# Patient Record
Sex: Male | Born: 1955 | Race: White | Hispanic: No | Marital: Married
Health system: Midwestern US, Academic
[De-identification: ages and names within clinical notes are randomized; demographics above are authoritative.]

---

## 2018-07-02 ENCOUNTER — Encounter: Admit: 2018-07-02 | Discharge: 2018-07-03 | Payer: Commercial Managed Care - HMO

## 2018-08-07 ENCOUNTER — Encounter: Admit: 2018-08-07 | Discharge: 2018-08-07 | Payer: Commercial Managed Care - HMO

## 2018-08-07 DIAGNOSIS — M545 Low back pain: Principal | ICD-10-CM

## 2018-08-08 ENCOUNTER — Encounter: Admit: 2018-08-08 | Discharge: 2018-08-08 | Payer: Commercial Managed Care - HMO

## 2018-08-08 DIAGNOSIS — E119 Type 2 diabetes mellitus without complications: Principal | ICD-10-CM

## 2018-08-08 DIAGNOSIS — I10 Essential (primary) hypertension: ICD-10-CM

## 2018-08-08 DIAGNOSIS — E7800 Pure hypercholesterolemia, unspecified: ICD-10-CM

## 2018-08-13 ENCOUNTER — Ambulatory Visit: Admit: 2018-08-13 | Discharge: 2018-08-13 | Payer: Commercial Managed Care - HMO

## 2018-08-13 ENCOUNTER — Encounter: Admit: 2018-08-13 | Discharge: 2018-08-13 | Payer: Commercial Managed Care - HMO

## 2018-08-13 DIAGNOSIS — E7800 Pure hypercholesterolemia, unspecified: ICD-10-CM

## 2018-08-13 DIAGNOSIS — I10 Essential (primary) hypertension: ICD-10-CM

## 2018-08-13 DIAGNOSIS — M545 Low back pain: Principal | ICD-10-CM

## 2018-08-13 DIAGNOSIS — E119 Type 2 diabetes mellitus without complications: Principal | ICD-10-CM

## 2018-08-13 DIAGNOSIS — M48062 Spinal stenosis, lumbar region with neurogenic claudication: ICD-10-CM

## 2018-09-12 ENCOUNTER — Encounter: Admit: 2018-09-12 | Discharge: 2018-09-12 | Payer: Commercial Managed Care - HMO

## 2018-11-05 ENCOUNTER — Ambulatory Visit: Admit: 2018-11-05 | Discharge: 2018-11-06 | Payer: Commercial Managed Care - HMO

## 2018-11-05 ENCOUNTER — Encounter: Admit: 2018-11-05 | Discharge: 2018-11-05 | Payer: Commercial Managed Care - HMO

## 2018-11-05 DIAGNOSIS — E119 Type 2 diabetes mellitus without complications: Principal | ICD-10-CM

## 2018-11-05 DIAGNOSIS — M48062 Spinal stenosis, lumbar region with neurogenic claudication: Principal | ICD-10-CM

## 2018-11-05 DIAGNOSIS — E7800 Pure hypercholesterolemia, unspecified: ICD-10-CM

## 2018-11-05 DIAGNOSIS — I10 Essential (primary) hypertension: ICD-10-CM

## 2018-11-05 NOTE — Patient Instructions
Can take Aleve, two in the morning and two at night.

## 2018-11-06 ENCOUNTER — Encounter: Admit: 2018-11-06 | Discharge: 2018-11-06 | Payer: Commercial Managed Care - HMO

## 2018-11-06 DIAGNOSIS — M4316 Spondylolisthesis, lumbar region: ICD-10-CM

## 2018-11-06 DIAGNOSIS — M5416 Radiculopathy, lumbar region: Principal | ICD-10-CM

## 2018-11-14 ENCOUNTER — Ambulatory Visit: Admit: 2018-11-14 | Discharge: 2018-11-15 | Payer: Commercial Managed Care - HMO

## 2018-11-14 ENCOUNTER — Encounter: Admit: 2018-11-14 | Discharge: 2018-11-14 | Payer: Commercial Managed Care - HMO

## 2018-11-14 DIAGNOSIS — E7800 Pure hypercholesterolemia, unspecified: ICD-10-CM

## 2018-11-14 DIAGNOSIS — M545 Low back pain: ICD-10-CM

## 2018-11-14 DIAGNOSIS — I10 Essential (primary) hypertension: ICD-10-CM

## 2018-11-14 DIAGNOSIS — E119 Type 2 diabetes mellitus without complications: Principal | ICD-10-CM

## 2018-11-14 LAB — POC GLUCOSE: Lab: 187 mg/dL — ABNORMAL HIGH (ref 70–100)

## 2018-11-14 MED ORDER — TRIAMCINOLONE ACETONIDE 40 MG/ML IJ SUSP
80 mg | Freq: Once | EPIDURAL | 0 refills | Status: CP
Start: 2018-11-14 — End: ?
  Administered 2018-11-14: 20:00:00 80 mg via EPIDURAL

## 2018-11-14 MED ORDER — IOHEXOL 240 MG IODINE/ML IV SOLN
2.5 mL | Freq: Once | EPIDURAL | 0 refills | Status: CP
Start: 2018-11-14 — End: ?
  Administered 2018-11-14: 20:00:00 2.5 mL via EPIDURAL

## 2018-11-14 NOTE — Patient Instructions
Procedure Completed Today: Lumbar Transforaminal Steroid Injection    Important information following your procedure today: You may drive today    1. Pain relief may not be immediate. It is possible you may even experience an increase in pain during the first 24-48 hours followed by a gradual decrease of your pain.  2. Though the procedure is generally safe and complications are rare, we do ask that you be aware of any of the following:   ? Any swelling, persistent redness, new bleeding, or drainage from the site of the injection.  ? You should not experience a severe headache.  ? You should not run a fever over 101? F.  ? New onset of sharp, severe back & or neck pain.  ? New onset of upper or lower extremity numbness or weakness.  ? New difficulty controlling bowel or bladder function after the injection.  ? New shortness of breath.    If any of these occur, please call to report this occurrence to a nurse at [phone]. If you are calling after 4:00 p.m., on weekends or holidays please call [phone] and ask to have the resident physician on call for the physician paged or go to your local emergency room.  3. You may experience soreness at the injection site. Ice can be applied at 20 minute intervals. Avoid application of direct heat, hot showers or hot tubs today.  4. Avoid strenuous activity today. You may resume your regular activities and exercise tomorrow.  5. Patients with diabetes may see an elevation in blood sugars for 7-10 days after the injection. It is important to pay close attention to your diet, check your blood sugars daily and report extreme elevations to the physician that treats your diabetes.  6. Patients taking a daily blood thinner can resume their regular dose this evening.  7. It is important that you take all medications ordered by your pain physician. Taking medication as ordered is an important part of your pain care plan. If you cannot continue the medication plan, please notify the physician.     Possible side effects to steroids that may occur:  ? Flushing or redness of the face  ? Irritability  ? Fluid retention  ? Change in women?s menses    The following medications were used: Lidocaine , Triamcinolone   and Contrast Dye

## 2018-11-14 NOTE — Progress Notes
???   Number of children: Not on file   ??? Years of education: Not on file   ??? Highest education level: Not on file   Occupational History   ??? Not on file   Tobacco Use   ??? Smoking status: Never Smoker   ??? Smokeless tobacco: Never Used   Substance and Sexual Activity   ??? Alcohol use: Never     Frequency: Never   ??? Drug use: Not on file   ??? Sexual activity: Not on file   Other Topics Concern   ??? Not on file   Social History Narrative   ??? Not on file       Allergies   Allergen Reactions   ??? Penicillins HIVES and EDEMA     Swollen throat and neck        Vitals:    11/14/18 1407   BP: (!) 151/93   BP Source: Arm, Right Upper   Patient Position: Sitting   Pulse: 96   Resp: 15   SpO2: 97%       REVIEW OF SYSTEMS: 10 point ROS obtained and negative except per HPI      PHYSICAL EXAM:  General: Alert, cooperative, no acute distress.  HEENT: Normocephalic, atraumatic.  Neck: Supple.  Lungs: Unlabored respirations, bilateral and equal chest excursion.  Heart: Regular rate.  Skin: Warm and dry to touch.  Abdomen: Nondistended.    Lumbar spine:  Appearance: No lesions or deformity  Lumbar tenderness: No  SI joint tenderness: Bilateral    Pain with extension: Yes  Pain with lateral flexion: Bilateral     Lower extremity strength: 5/5 bilaterally  Hip flexion, hip adduction, knee extension, knee flexion, dorsiflexion, hallux extension, plantarflexion tested  Sensation to light touch: Intact and equal in the bilateral lower extremities with exception of posterolateral bilateral lower extremities, right greater than left, into dorsum of foot and lateral edge and lateral plantar surface of foot bilaterally to light touch.  Reflexes:  2/4 in bilateral patellar and achilles tendons    Straight leg raise positive?: None    Neurological: Alert and oriented x3.        IMPRESSION:    1. Spinal stenosis of lumbar region with neurogenic claudication    2. Spondylolisthesis, lumbar region PLAN: Lumbar Transforaminal Steroid Injection L4-L5, L5-S1 Right Sided    Mickel Crow, MD  PGY 5, Pain Fellow  Department of Anesthesia and Pain Management  Pager [phone] (Chronic Pain Service)

## 2018-11-15 ENCOUNTER — Ambulatory Visit: Admit: 2018-11-14 | Discharge: 2018-11-15 | Payer: Commercial Managed Care - HMO

## 2018-11-15 DIAGNOSIS — E7800 Pure hypercholesterolemia, unspecified: ICD-10-CM

## 2018-11-15 DIAGNOSIS — E785 Hyperlipidemia, unspecified: ICD-10-CM

## 2018-11-15 DIAGNOSIS — Z880 Allergy status to penicillin: ICD-10-CM

## 2018-11-15 DIAGNOSIS — M4316 Spondylolisthesis, lumbar region: ICD-10-CM

## 2018-11-15 DIAGNOSIS — M5116 Intervertebral disc disorders with radiculopathy, lumbar region: Principal | ICD-10-CM

## 2018-11-15 DIAGNOSIS — E119 Type 2 diabetes mellitus without complications: ICD-10-CM

## 2018-11-15 DIAGNOSIS — M48062 Spinal stenosis, lumbar region with neurogenic claudication: ICD-10-CM

## 2018-11-15 DIAGNOSIS — I10 Essential (primary) hypertension: ICD-10-CM

## 2018-11-28 ENCOUNTER — Encounter: Admit: 2018-11-28 | Discharge: 2018-11-28 | Payer: Commercial Managed Care - HMO

## 2018-12-03 ENCOUNTER — Encounter: Admit: 2018-12-03 | Discharge: 2018-12-03 | Payer: Commercial Managed Care - HMO

## 2018-12-03 DIAGNOSIS — M48062 Spinal stenosis, lumbar region with neurogenic claudication: Principal | ICD-10-CM

## 2018-12-12 ENCOUNTER — Encounter: Admit: 2018-12-12 | Discharge: 2018-12-12 | Payer: Commercial Managed Care - HMO

## 2018-12-26 ENCOUNTER — Encounter: Admit: 2018-12-26 | Discharge: 2018-12-26 | Payer: Commercial Managed Care - HMO

## 2018-12-26 NOTE — Telephone Encounter
Left message with patient and/or wife asking them to return call to gather information needed about previous injection for insurance approval for procedure on 03/04/2019.   Will need to determine if 1)pain was reduced by 50% 2) level of function increased 3) patient was able to decrease use of medication and/or therapy.    Asked for a return call to RN.

## 2019-01-11 ENCOUNTER — Encounter: Admit: 2019-01-11 | Discharge: 2019-01-11 | Payer: Commercial Managed Care - HMO

## 2019-01-11 NOTE — Telephone Encounter
Called patient and left message with patient asking her to return call to gather information needed about previous injection for insurance approval for procedure on 03/04/2019.   Will need to determine if 1)pain was reduced by 50% 2) level of function increased 3) patient was able to decrease use of medication and/or therapy.  ???

## 2019-01-23 ENCOUNTER — Encounter: Admit: 2019-01-23 | Discharge: 2019-01-23 | Payer: Commercial Managed Care - HMO

## 2019-01-23 NOTE — Telephone Encounter
Patient was called 4 times   12/12/2018, 12/14/2018, 12/26/2018, 01/11/2019 with request for additional information needed for insurance authorization for procedure scheduled on 03/04/2019.  Patients appointment cancelled.

## 2019-02-28 ENCOUNTER — Encounter: Admit: 2019-02-28 | Discharge: 2019-02-28

## 2019-02-28 DIAGNOSIS — M4316 Spondylolisthesis, lumbar region: Secondary | ICD-10-CM

## 2019-02-28 DIAGNOSIS — M5116 Intervertebral disc disorders with radiculopathy, lumbar region: Secondary | ICD-10-CM

## 2019-02-28 DIAGNOSIS — M48062 Spinal stenosis, lumbar region with neurogenic claudication: Secondary | ICD-10-CM

## 2019-03-22 ENCOUNTER — Encounter: Admit: 2019-03-22 | Discharge: 2019-03-22

## 2019-03-22 DIAGNOSIS — M48062 Spinal stenosis, lumbar region with neurogenic claudication: Secondary | ICD-10-CM

## 2019-03-25 ENCOUNTER — Encounter: Admit: 2019-03-25 | Discharge: 2019-03-25

## 2019-03-25 ENCOUNTER — Ambulatory Visit: Admit: 2019-03-25 | Discharge: 2019-03-26

## 2019-03-25 DIAGNOSIS — M48062 Spinal stenosis, lumbar region with neurogenic claudication: Secondary | ICD-10-CM

## 2019-03-25 DIAGNOSIS — E7800 Pure hypercholesterolemia, unspecified: Secondary | ICD-10-CM

## 2019-03-25 DIAGNOSIS — I10 Essential (primary) hypertension: Secondary | ICD-10-CM

## 2019-03-25 DIAGNOSIS — M5116 Intervertebral disc disorders with radiculopathy, lumbar region: Secondary | ICD-10-CM

## 2019-03-25 DIAGNOSIS — E119 Type 2 diabetes mellitus without complications: Secondary | ICD-10-CM

## 2019-03-25 MED ORDER — TRIAMCINOLONE ACETONIDE 40 MG/ML IJ SUSP
80 mg | Freq: Once | EPIDURAL | 0 refills | Status: CP
Start: 2019-03-25 — End: ?
  Administered 2019-03-25: 16:00:00 80 mg via EPIDURAL

## 2019-03-25 MED ORDER — IOHEXOL 240 MG IODINE/ML IV SOLN
2.5 mL | Freq: Once | EPIDURAL | 0 refills | Status: CP
Start: 2019-03-25 — End: ?
  Administered 2019-03-25: 16:00:00 2.5 mL via EPIDURAL

## 2019-03-25 NOTE — Patient Instructions
Procedure Completed Today: Lumbar Transforaminal Steroid Injection    Important information following your procedure today: You may drive today    1. Pain relief may not be immediate. It is possible you may even experience an increase in pain during the first 24-48 hours followed by a gradual decrease of your pain.  2. Though the procedure is generally safe and complications are rare, we do ask that you be aware of any of the following:   ? Any swelling, persistent redness, new bleeding, or drainage from the site of the injection.  ? You should not experience a severe headache.  ? You should not run a fever over 101? F.  ? New onset of sharp, severe back & or neck pain.  ? New onset of upper or lower extremity numbness or weakness.  ? New difficulty controlling bowel or bladder function after the injection.  ? New shortness of breath.    If any of these occur, please call to report this occurrence to a nurse at [phone]. If you are calling after 4:00 p.m., on weekends or holidays please call [phone] and ask to have the resident physician on call for the physician paged or go to your local emergency room.  3. You may experience soreness at the injection site. Ice can be applied at 20 minute intervals. Avoid application of direct heat, hot showers or hot tubs today.  4. Avoid strenuous activity today. You may resume your regular activities and exercise tomorrow.  5. Patients with diabetes may see an elevation in blood sugars for 7-10 days after the injection. It is important to pay close attention to your diet, check your blood sugars daily and report extreme elevations to the physician that treats your diabetes.  6. Patients taking a daily blood thinner can resume their regular dose this evening.  7. It is important that you take all medications ordered by your pain physician. Taking medication as ordered is an important part of your pain care plan. If you cannot continue the medication plan, please notify the physician.     Possible side effects to steroids that may occur:  ? Flushing or redness of the face  ? Irritability  ? Fluid retention  ? Change in women?s menses    The following medications were used: Lidocaine , Triamcinolone   and Contrast Dye

## 2019-03-25 NOTE — Progress Notes
[facility]  INTERVENTIONAL PAIN PROCEDURE HISTORY AND PHYSICAL    Chief Complaint   Patient presents with   ??? Lower Back - Pain   ??? Procedure       HISTORY OF PRESENT ILLNESS:  Trevor Benitez presents in follow up for care regarding back and right leg pain, the last injection helped more than 50% for 2-3 months    Medical History:   Diagnosis Date   ??? Diabetes (HCC)    ??? High cholesterol    ??? Hypertension        Surgical History:   Procedure Laterality Date   ??? COLONOSCOPY         family history includes Heart problem in his mother; Stroke in his father.    Social History     Socioeconomic History   ??? Marital status: Married     Spouse name: Not on file   ??? Number of children: Not on file   ??? Years of education: Not on file   ??? Highest education level: Not on file   Occupational History   ??? Not on file   Tobacco Use   ??? Smoking status: Never Smoker   ??? Smokeless tobacco: Never Used   Substance and Sexual Activity   ??? Alcohol use: Never     Frequency: Never   ??? Drug use: Not on file   ??? Sexual activity: Not on file   Other Topics Concern   ??? Not on file   Social History Narrative   ??? Not on file       Allergies   Allergen Reactions   ??? Penicillins HIVES and EDEMA     Swollen throat and neck        Vitals:    03/25/19 1022   BP: (!) 158/89   BP Source: Arm, Right Upper   Patient Position: Sitting   Pulse: 91   Resp: 16   Temp: 36.9 ???C (98.5 ???F)   TempSrc: Oral   SpO2: 98%   Weight: 98.9 kg (218 lb)   PainSc: Five       REVIEW OF SYSTEMS: 10 point ROS obtained and negative except back and leg pain      PHYSICAL EXAM:  General: Alert and oriented, very pleasant male.   HEENT showed extraocular muscles were intact and no other abnormalities.  Unlabored breathing.  Regular rate and rhythm on CV exam.   5/5 strength in bilateral upper and lower extremities.    Sensation is intact to light touch and equal in the upper and lower extremities.    Right lumbar tenderness to palpation      IMPRESSION: 1. Spinal stenosis of lumbar region with neurogenic claudication    2. Spondylolisthesis, lumbar region    3. Radiculopathy due to lumbar intervertebral disc disorder         PLAN: Lumbar Transforaminal Steroid Injection at right L4-5 and L5-S1

## 2019-03-25 NOTE — Progress Notes

## 2019-03-26 ENCOUNTER — Encounter: Admit: 2019-03-26 | Discharge: 2019-03-26

## 2019-03-26 ENCOUNTER — Ambulatory Visit: Admit: 2019-03-25 | Discharge: 2019-03-26

## 2019-03-26 DIAGNOSIS — M4316 Spondylolisthesis, lumbar region: Secondary | ICD-10-CM

## 2019-03-26 DIAGNOSIS — M5116 Intervertebral disc disorders with radiculopathy, lumbar region: Secondary | ICD-10-CM

## 2019-03-26 NOTE — Procedures
Attending Surgeon: Gabriel Rung, MD    Anesthesia: Local    Pre-Procedure Diagnosis:   1. Spinal stenosis of lumbar region with neurogenic claudication    2. Spondylolisthesis, lumbar region    3. Radiculopathy due to lumbar intervertebral disc disorder        Post-Procedure Diagnosis:   1. Spinal stenosis of lumbar region with neurogenic claudication    2. Spondylolisthesis, lumbar region    3. Radiculopathy due to lumbar intervertebral disc disorder        SNR/TF LMBR/SAC  Procedure: transforaminal epidural    Laterality: right    Location: lumbar - L4-5 and L5-S1      Consent:   Consent obtained: written  Consent given by: patient  Risks discussed: allergic reaction, bleeding, infection, weakness and no change or worsening in pain  Alternatives discussed: alternative treatment     Universal Protocol:  Relevant documents: relevant documents present and verified  Test results: test results available and properly labeled  Imaging studies: imaging studies available  Required items: required blood products, implants, devices, and special equipment available  Site marked: the operative site was marked  Patient identity confirmed: Patient identify confirmed verbally with patient.        Time out: Immediately prior to procedure a time out was called to verify the correct patient, procedure, equipment, support staff and site/side marked as required      Procedures Details:   Indications: pain   Prep: chlorhexidine  Estimated Blood Loss: minimal  Specimens: none  Approach: right paramedian  Needle size: 25 G  Injection procedure: Negative aspiration for blood  Patient tolerance: Patient tolerated the procedure well with no immediate complications. Pressure was applied, and hemostasis was accomplished.  Outcome: Pain improved  Comments: Kenalog 40 mg was injected at each location        Estimated blood loss: none or minimal  Specimens: none  Patient tolerated the procedure well with no immediate complications. Pressure was applied, and hemostasis was accomplished.

## 2019-06-17 ENCOUNTER — Encounter: Admit: 2019-06-17 | Discharge: 2019-06-17 | Payer: Commercial Managed Care - HMO

## 2019-06-17 NOTE — Telephone Encounter
Left message with patient for results from right TFESI L4-S1 on 03/25/2019.  Per insurance needs: pain relieved by 50%, increase in level of function and decrease use of medication or therapy.

## 2019-06-25 ENCOUNTER — Encounter: Admit: 2019-06-25 | Discharge: 2019-06-25 | Payer: Commercial Managed Care - HMO

## 2021-04-11 IMAGING — CR CHEST
2 series · 2 of 2 positions shown · non-contrast
Comparison: none

[chest pa x-wise]
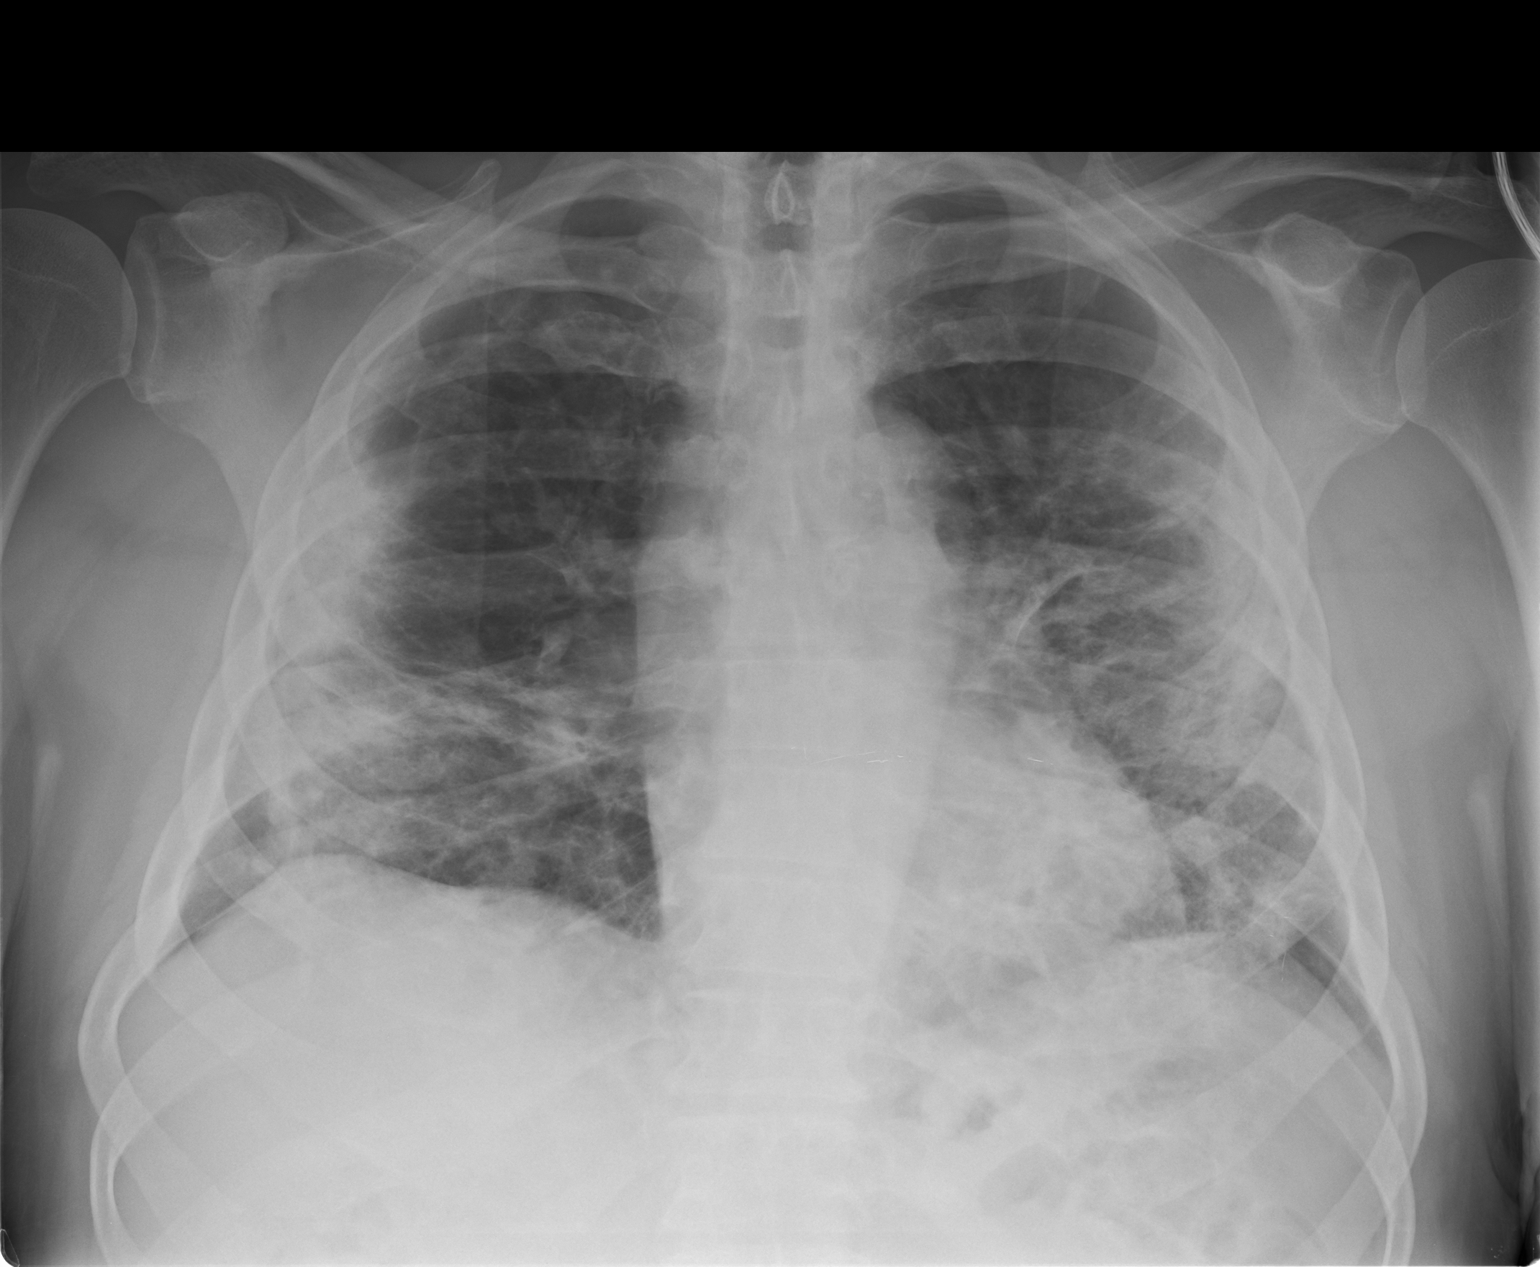

[chest lat]
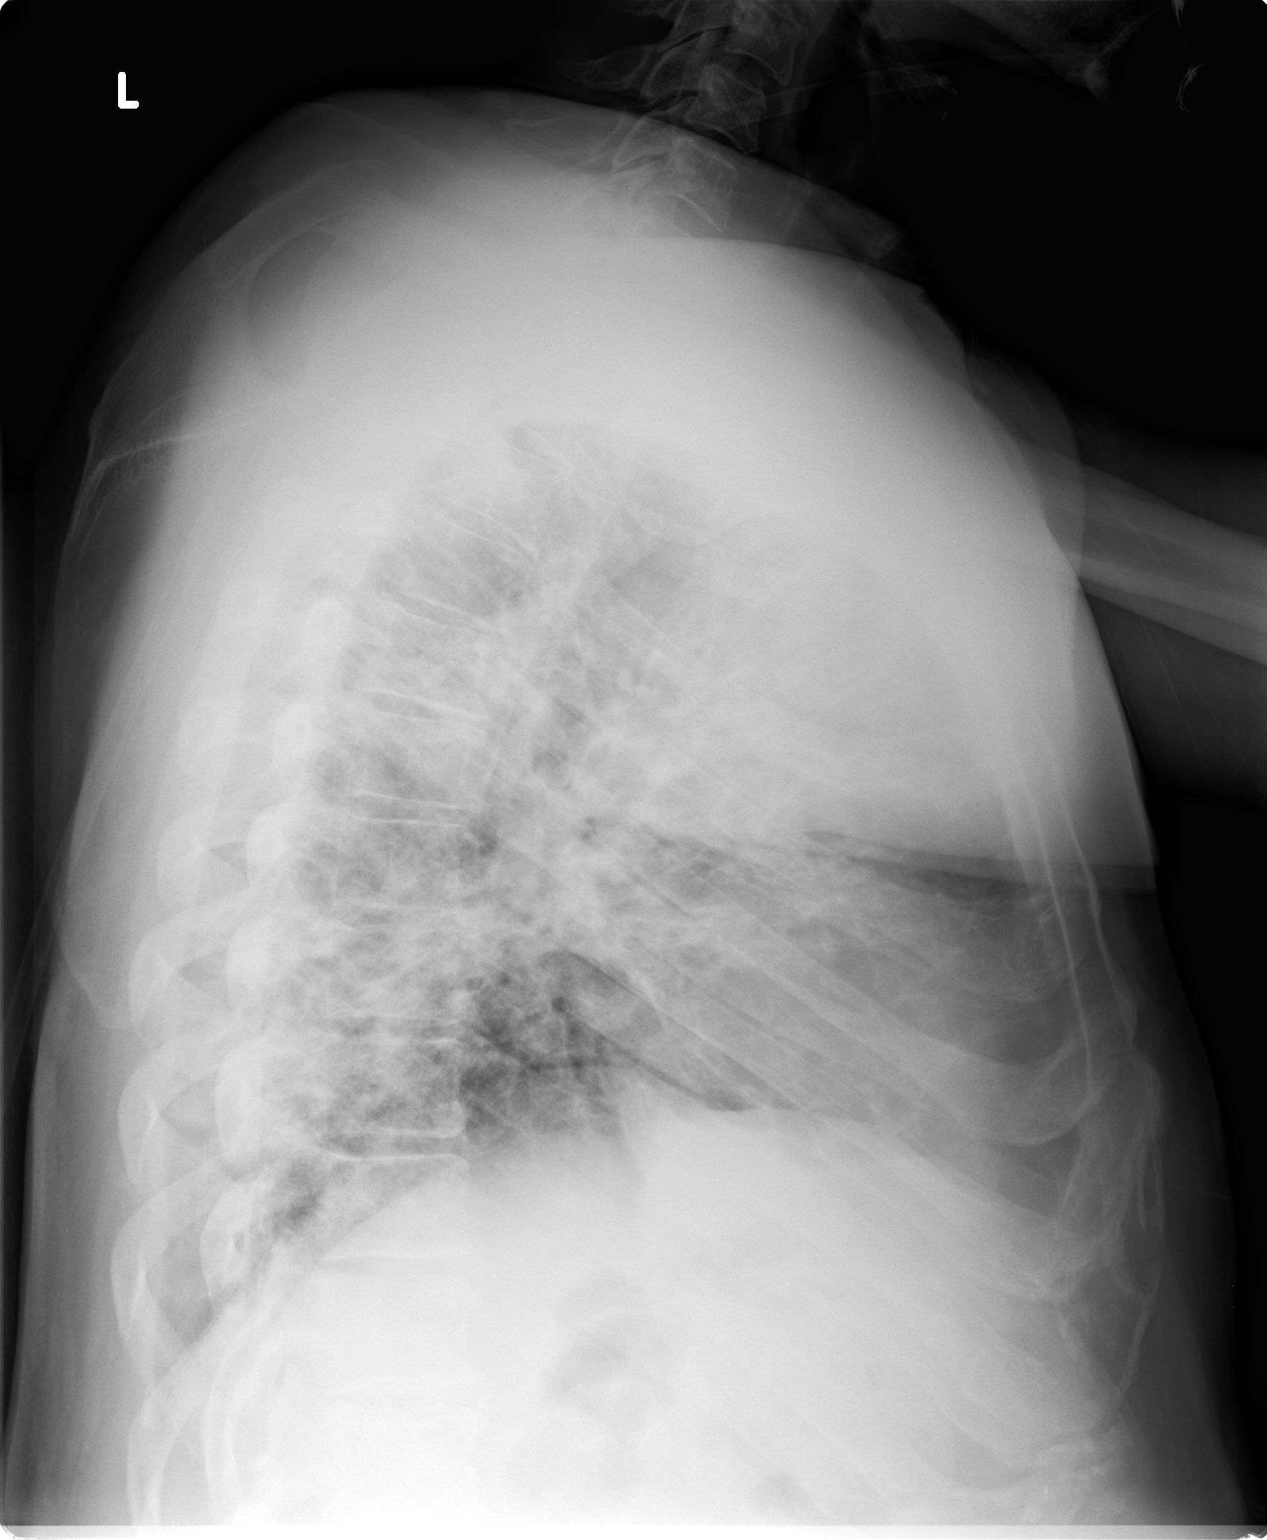

[2 of 2 positions shown; findings below may reference images not displayed]

EXAM
Two views of the chest

INDICATION
covid+
Covid + follow up. Prior CXR 06/16/19. [facility]

TECHNIQUE
Two views the chest

COMPARISONS
06/16/2019

FINDINGS
Please see impression. Normal heart size. No pleural effusion. Skeletal structures appear
unchanged.

IMPRESSION
Continued peripheral predominant bilateral infiltrates, grossly similar to the prior exam.

Tech Notes:

Covid + follow up. Prior CXR 06/16/19. [facility]

## 2021-04-13 IMAGING — CR CHEST
2 series · 2 of 2 positions shown · non-contrast
Comparison: none

[chest pa x-wise]
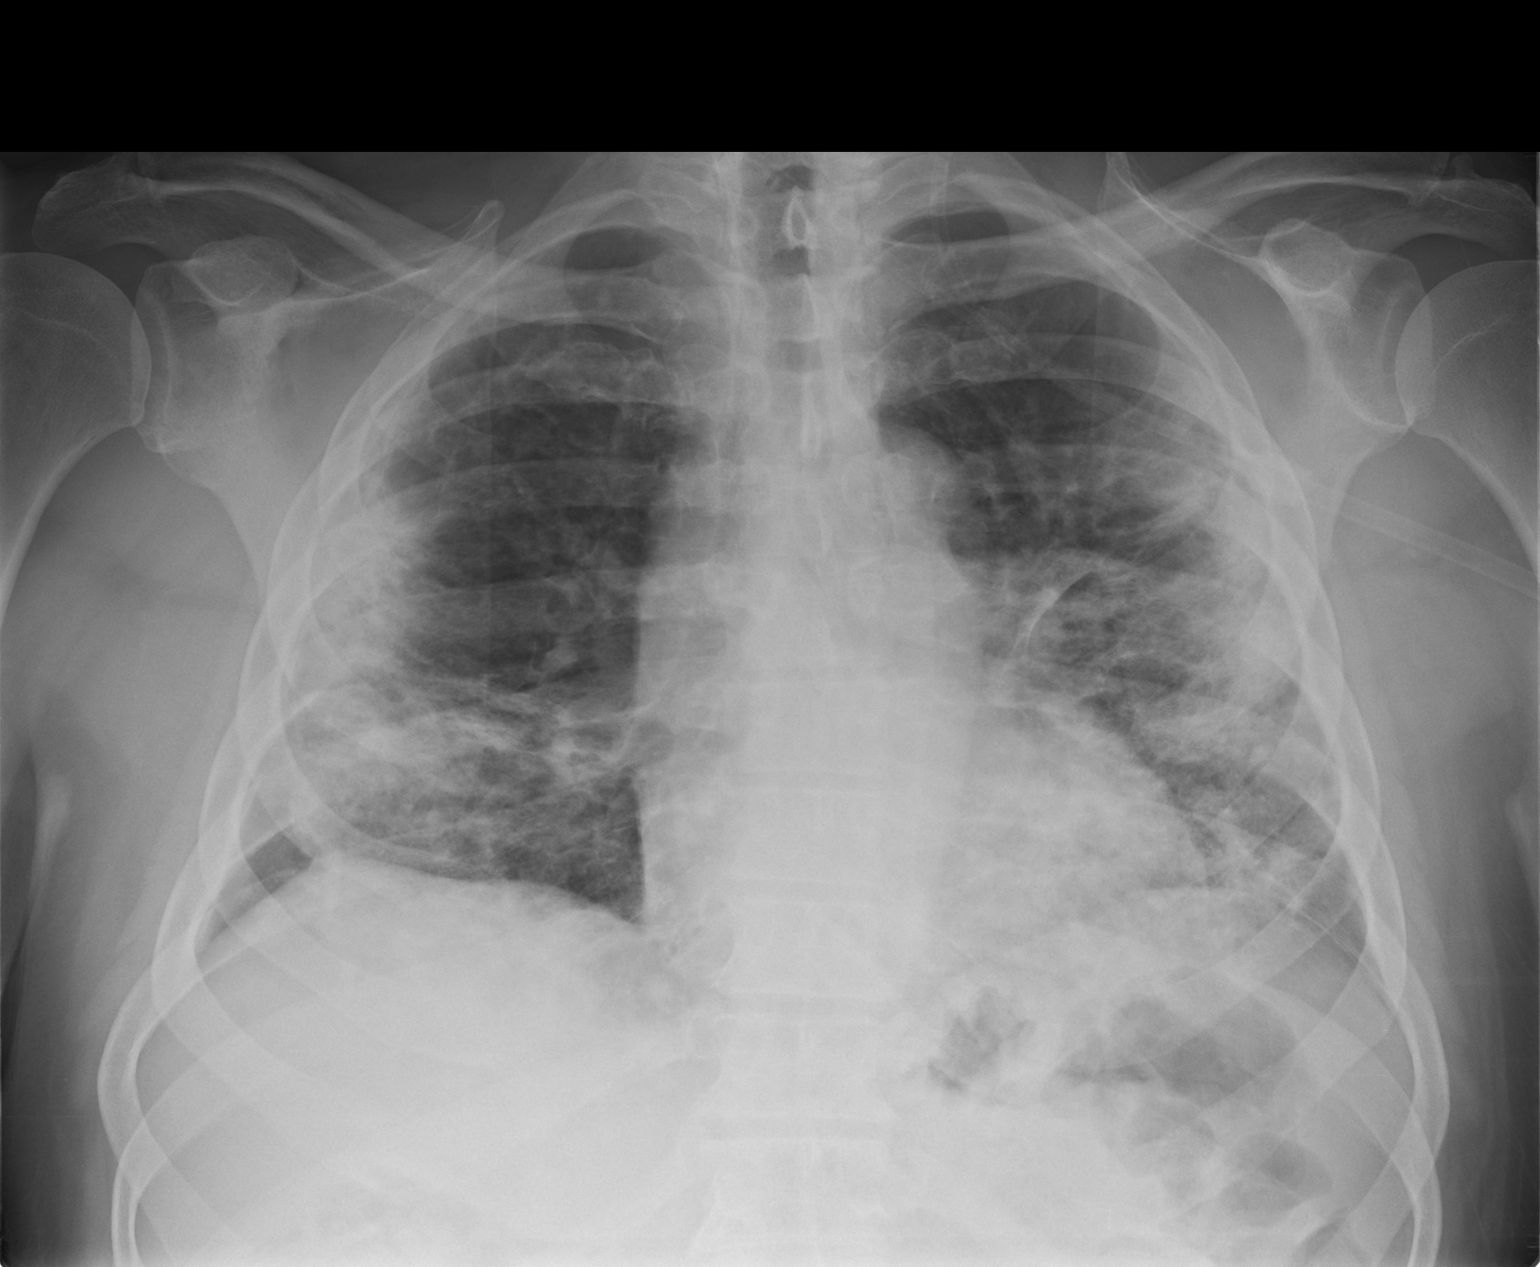

[chest pa]
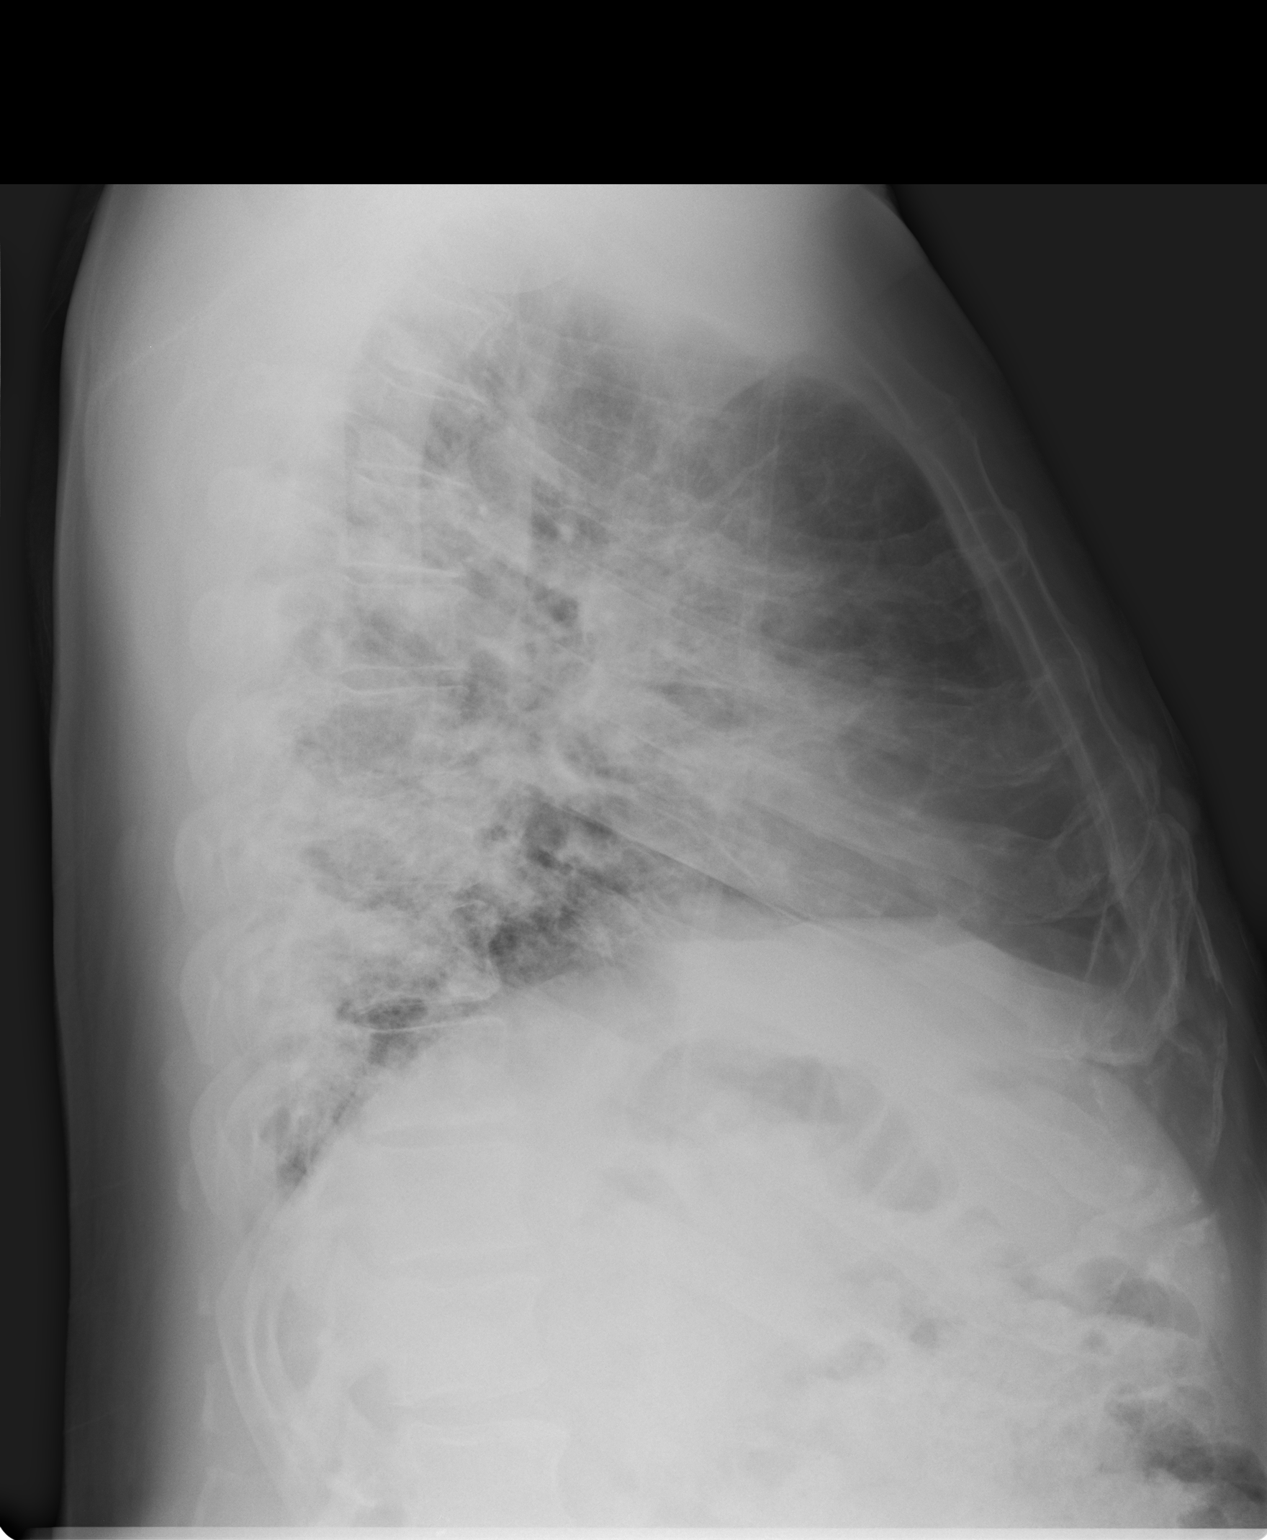

[2 of 2 positions shown; findings below may reference images not displayed]

DIAGNOSTIC STUDIES

EXAM

Chest two views.

INDICATION

pulmonary edema
pulmonary edema. covid+

TECHNIQUE

Upright PA and lateral.

COMPARISONS

Chest two views dated June 17, 2019

FINDINGS

Bilateral heterogeneous pulmonary opacity is are present. There is a mid to lower lung zone
subpleural predominant distribution. This is slightly worse within the lung bases. No acute osseous
abnormality. Stable cardiomediastinal silhouette. No pleural effusion or pneumothorax.

IMPRESSION

Slightly worsening heterogeneous opacities throughout both lungs. This is suggestive of worsening
pulmonary involvement due to the patient's known 4ovid-JY infection.

Tech Notes:

pulmonary edema. covid+
